# Patient Record
Sex: Female | Born: 1975 | Race: Black or African American | Hispanic: No | Marital: Single | Smoking: Never smoker
Health system: Southern US, Community
[De-identification: ages and names within clinical notes are randomized; demographics above are authoritative.]

## PROBLEM LIST (undated history)

## (undated) DIAGNOSIS — R51 Headache: Secondary | ICD-10-CM

## (undated) DIAGNOSIS — N189 Chronic kidney disease, unspecified: Secondary | ICD-10-CM

## (undated) DIAGNOSIS — B029 Zoster without complications: Secondary | ICD-10-CM

## (undated) DIAGNOSIS — M329 Systemic lupus erythematosus, unspecified: Secondary | ICD-10-CM

## (undated) DIAGNOSIS — G709 Myoneural disorder, unspecified: Secondary | ICD-10-CM

## (undated) HISTORY — PX: WISDOM TOOTH EXTRACTION: SHX21

## (undated) HISTORY — PX: RENAL BIOPSY: SHX156

## (undated) HISTORY — DX: Zoster without complications: B02.9

---

## 2008-06-04 ENCOUNTER — Other Ambulatory Visit: Admission: RE | Admit: 2008-06-04 | Discharge: 2008-06-04 | Payer: Self-pay | Admitting: Obstetrics & Gynecology

## 2008-07-28 ENCOUNTER — Emergency Department (HOSPITAL_COMMUNITY): Admission: EM | Admit: 2008-07-28 | Discharge: 2008-07-28 | Payer: Self-pay | Admitting: Emergency Medicine

## 2008-07-29 ENCOUNTER — Ambulatory Visit: Payer: Self-pay | Admitting: Vascular Surgery

## 2008-07-29 ENCOUNTER — Ambulatory Visit: Admission: RE | Admit: 2008-07-29 | Discharge: 2008-07-29 | Payer: Self-pay | Admitting: Emergency Medicine

## 2008-07-29 ENCOUNTER — Encounter (INDEPENDENT_AMBULATORY_CARE_PROVIDER_SITE_OTHER): Payer: Self-pay | Admitting: Emergency Medicine

## 2008-09-25 ENCOUNTER — Encounter: Admission: RE | Admit: 2008-09-25 | Discharge: 2008-09-25 | Payer: Self-pay | Admitting: Internal Medicine

## 2009-07-24 ENCOUNTER — Emergency Department (HOSPITAL_COMMUNITY): Admission: EM | Admit: 2009-07-24 | Discharge: 2009-07-24 | Payer: Self-pay | Admitting: Emergency Medicine

## 2009-11-27 ENCOUNTER — Observation Stay (HOSPITAL_COMMUNITY): Admission: EM | Admit: 2009-11-27 | Discharge: 2009-11-28 | Payer: Self-pay | Admitting: Emergency Medicine

## 2010-01-17 ENCOUNTER — Encounter
Admission: RE | Admit: 2010-01-17 | Discharge: 2010-01-17 | Payer: Self-pay | Source: Home / Self Care | Attending: Unknown Physician Specialty | Admitting: Unknown Physician Specialty

## 2010-06-08 LAB — BASIC METABOLIC PANEL
CO2: 27 mEq/L (ref 19–32)
Calcium: 8.3 mg/dL — ABNORMAL LOW (ref 8.4–10.5)
Creatinine, Ser: 0.58 mg/dL (ref 0.4–1.2)
GFR calc Af Amer: >60 mL/min (ref >60)
GFR calc non Af Amer: >60 mL/min (ref >60)
GFR calc non Af Amer: >60 mL/min (ref >60)
Potassium: 3.8 mEq/L (ref 3.5–5.1)
Sodium: 137 mEq/L (ref 135–145)
Sodium: 139 mEq/L (ref 135–145)

## 2010-06-08 LAB — URINALYSIS, ROUTINE W REFLEX MICROSCOPIC
Bilirubin Urine: NEGATIVE
Glucose, UA: NEGATIVE mg/dL
Ketones, ur: NEGATIVE mg/dL
Leukocytes, UA: NEGATIVE
Nitrite: NEGATIVE
Protein, ur: >300 mg/dL — AB
Specific Gravity, Urine: 1.012 (ref 1.005–1.030)
Urobilinogen, UA: 0.2 mg/dL (ref 0.0–1.0)
pH: 7 (ref 5.0–8.0)

## 2010-06-08 LAB — POCT I-STAT, CHEM 8
BUN: 21 mg/dL (ref 6–23)
Calcium, Ion: 1.2 mmol/L (ref 1.12–1.32)
Chloride: 107 mEq/L (ref 96–112)
Creatinine, Ser: 0.7 mg/dL (ref 0.4–1.2)
Glucose, Bld: 124 mg/dL — ABNORMAL HIGH (ref 70–99)
HCT: 41 % (ref 36.0–46.0)
Hemoglobin: 13.9 g/dL (ref 12.0–15.0)
Potassium: 4.4 mEq/L (ref 3.5–5.1)
Sodium: 139 meq/L (ref 135–145)
TCO2: 25 mmol/L (ref 0–100)

## 2010-06-08 LAB — URINE MICROSCOPIC-ADD ON

## 2010-06-08 LAB — CK: Total CK: 116 U/L (ref 7–177)

## 2010-08-16 ENCOUNTER — Other Ambulatory Visit: Payer: Self-pay | Admitting: Obstetrics & Gynecology

## 2010-08-16 DIAGNOSIS — N920 Excessive and frequent menstruation with regular cycle: Secondary | ICD-10-CM

## 2010-08-17 ENCOUNTER — Ambulatory Visit (HOSPITAL_COMMUNITY)
Admission: RE | Admit: 2010-08-17 | Discharge: 2010-08-17 | Disposition: A | Discharge status: Home / Self Care | Payer: BC Managed Care – PPO | Source: Ambulatory Visit | Attending: Obstetrics & Gynecology | Admitting: Obstetrics & Gynecology

## 2010-08-17 ENCOUNTER — Other Ambulatory Visit (HOSPITAL_COMMUNITY): Payer: Self-pay

## 2010-08-17 DIAGNOSIS — N920 Excessive and frequent menstruation with regular cycle: Secondary | ICD-10-CM | POA: Insufficient documentation

## 2010-08-17 DIAGNOSIS — D252 Subserosal leiomyoma of uterus: Secondary | ICD-10-CM | POA: Insufficient documentation

## 2010-10-18 ENCOUNTER — Encounter (INDEPENDENT_AMBULATORY_CARE_PROVIDER_SITE_OTHER): Payer: BC Managed Care – PPO | Admitting: Ophthalmology

## 2010-10-18 DIAGNOSIS — H43819 Vitreous degeneration, unspecified eye: Secondary | ICD-10-CM

## 2010-10-18 DIAGNOSIS — H33309 Unspecified retinal break, unspecified eye: Secondary | ICD-10-CM

## 2010-10-19 ENCOUNTER — Ambulatory Visit (INDEPENDENT_AMBULATORY_CARE_PROVIDER_SITE_OTHER): Payer: BC Managed Care – PPO | Admitting: Ophthalmology

## 2011-04-04 ENCOUNTER — Encounter (HOSPITAL_COMMUNITY): Payer: Self-pay | Admitting: Pharmacist

## 2011-04-09 ENCOUNTER — Encounter (HOSPITAL_COMMUNITY): Admission: RE | Admit: 2011-04-09 | Payer: BC Managed Care – PPO | Source: Ambulatory Visit

## 2011-04-10 ENCOUNTER — Encounter (HOSPITAL_COMMUNITY): Payer: Self-pay

## 2011-04-10 ENCOUNTER — Encounter (HOSPITAL_COMMUNITY)
Admission: RE | Admit: 2011-04-10 | Discharge: 2011-04-10 | Disposition: A | Discharge status: Home / Self Care | Payer: BC Managed Care – PPO | Source: Ambulatory Visit | Attending: Obstetrics and Gynecology | Admitting: Obstetrics and Gynecology

## 2011-04-10 HISTORY — DX: Systemic lupus erythematosus, unspecified: M32.9

## 2011-04-10 HISTORY — DX: Headache: R51

## 2011-04-10 HISTORY — DX: Chronic kidney disease, unspecified: N18.9

## 2011-04-10 HISTORY — DX: Myoneural disorder, unspecified: G70.9

## 2011-04-10 LAB — CBC
HCT: 36.3 % (ref 36.0–46.0)
Hemoglobin: 12.1 g/dL (ref 12.0–15.0)
MCH: 30.9 pg (ref 26.0–34.0)
MCHC: 33.3 g/dL (ref 30.0–36.0)
MCV: 92.6 fL (ref 78.0–100.0)
Platelets: 262 K/uL (ref 150–400)
RBC: 3.92 MIL/uL (ref 3.87–5.11)
RDW: 13.7 % (ref 11.5–15.5)
WBC: 5.8 K/uL (ref 4.0–10.5)

## 2011-04-10 LAB — SURGICAL PCR SCREEN: MRSA, PCR: NEGATIVE

## 2011-04-10 LAB — BASIC METABOLIC PANEL WITH GFR
BUN: 15 mg/dL (ref 6–23)
CO2: 26 meq/L (ref 19–32)
Calcium: 9.5 mg/dL (ref 8.4–10.5)
Chloride: 103 meq/L (ref 96–112)
Creatinine, Ser: 0.6 mg/dL (ref 0.50–1.10)
GFR calc Af Amer: >90 mL/min
GFR calc non Af Amer: >90 mL/min
Glucose, Bld: 104 mg/dL — ABNORMAL HIGH (ref 70–99)
Potassium: 4 meq/L (ref 3.5–5.1)
Sodium: 136 meq/L (ref 135–145)

## 2011-04-10 NOTE — Patient Instructions (Addendum)
YOUR PROCEDURE IS SCHEDULED ON:04/13/11  ENTER THROUGH THE MAIN ENTRANCE OF [facility] AT:10am  USE DESK PHONE AND DIAL [phone] TO INFORM us OF YOUR ARRIVAL  CALL [phone] IF YOU HAVE ANY QUESTIONS OR PROBLEMS PRIOR TO YOUR ARRIVAL.  REMEMBER: DO NOT EAT OR DRINK AFTER MIDNIGHT :Thursday  YOU MAY BRUSH YOUR TEETH THE MORNING OF SURGERY   TAKE THESE MEDICINES THE DAY OF SURGERY WITH SIP OF WATER:none   DO NOT WEAR JEWELRY, EYE MAKEUP, LIPSTICK OR DARK FINGERNAIL POLISH DO NOT WEAR LOTIONS on day of surgery. DO NOT SHAVE FOR 48 HOURS PRIOR TO SURGERY  YOU WILL NOT BE ALLOWED TO DRIVE YOURSELF HOME.  NAME OF DRIVER:Joseph or Mathis Dad

## 2011-04-12 NOTE — Consult Note (Addendum)
  HISTORY OF PRESENT ILLNESS: Terry Ayala is a 36 year old, gravida 0, African-American female, initially referred to me by Lonie Peak, MD, scheduled for a laparoscopic robotic-assisted myomectomy.  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  She was adequately counseled about her options such as laparoscopy versus laparotomy, since she is also interested in future childbearing.  She is also interested in fertility preservation for social reasons, which we will address after she recovers from the surgery.  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  MEDICATIONS: 1.  Prednisone. 2.  Plaquenil. 3.  Imuran. 4.  Neurontin 5.  Os-Cal. 6.  Multivitamins. 7.  Biotin. 8.  Fish oil. 9.  Lotensin.  (She was advised by her rheumatologist to discontinue the preventive Lotensin before.) surgery.  ALLERGIES:  DARVOCET.  SOCIAL HISTORY:  She denies substance abuse.  She exercises regularly.  She is single and works as Land.  SURGICAL HISTORY:  She has had kidney biopsies in 1996 and 1997.  REVIEW OF SYSTEMS:  All 12 systems were reviewed as documented in the attached questionnaire.  It is pertinent for lupus erythematosus, numbness, migraine and unexplained rash, as well as headaches.  FAMILY HISTORY:  Maternal grandmother and grandfather had a stroke from high blood pressure.  Paternal grandparents died from heart attack and maternal aunt has breast cancer.  PHYSICAL EXAM: GENERAL:  Well-developed, well-nourished African-American female in no acute distress. VITAL SIGNS:  See vital signs taken. HEENT:  No lesions, no lymphadenopathy. LUNGS:  Clear. HEART:  Regular rhythm and rate. ABDOMEN:  Soft, nontender, no hepatosplenomegaly. PELVIC EXAM:  Had to be limited because  of the patient's two transvaginal and transabdominal ultrasound, because the patient had extreme discomfort with insertion of even the slender transvaginal probe.  External genitalia, Bartholin's, Skene's and urethra normal.  Vagina is normal.  Transvaginal and transabdominal ultrasound shows a myomatous uterus with overall dimensions of the corpus of 12.2 x 11 x 11.8 cm.  Moving scanning from right to left, a transmural myoma 6.5 x 6.1 cm was seen, which was distorting the endometrium by pressure effect.  A fundal subserosal myoma 5.2 x 5.1 cm and anterior subserosal myoma 1.9 x 1.8 cm.  A posterior fundal intramural myoma of 2.9 x 3.1cm, left anterior myoma 4.9 x 3.3 cm were also noted.  Antral follicle count in each ovary is 6-8 and a corpus luteum present in the right ovary.  A saline contrast sonohysterogram was also indicated in this patient.  However, because of her discomfort we refrained from doing one today.  IMPRESSION: 1.  Symptomatic multiple large transmural, intramural and subserosal myomas. 2.  Desire to preserve fertility, the patient's desire for future childbearing.  PLAN:  I again discussed the options, the patient is definitely interested in laparoscopic robotic-assisted myomectomy.  The benefits and risks were explained, and she confirmed verbalized understanding.  We will proceed with preoperative anesthesia evaluation since she has SLE and preoperative laboratory.  The surgery is scheduled for 04/13/2011.

## 2011-04-13 ENCOUNTER — Other Ambulatory Visit: Payer: Self-pay | Admitting: Obstetrics and Gynecology

## 2011-04-13 ENCOUNTER — Encounter (HOSPITAL_COMMUNITY)
Admission: RE | Disposition: A | Discharge status: Home / Self Care | Payer: Self-pay | Source: Ambulatory Visit | Attending: Obstetrics and Gynecology

## 2011-04-13 ENCOUNTER — Ambulatory Visit (HOSPITAL_COMMUNITY): Payer: BC Managed Care – PPO | Admitting: Anesthesiology

## 2011-04-13 ENCOUNTER — Encounter (HOSPITAL_COMMUNITY): Payer: Self-pay | Admitting: *Deleted

## 2011-04-13 ENCOUNTER — Ambulatory Visit (HOSPITAL_COMMUNITY)
Admission: RE | Admit: 2011-04-13 | Discharge: 2011-04-14 | Disposition: A | Discharge status: Home / Self Care | Payer: BC Managed Care – PPO | Source: Ambulatory Visit | Attending: Obstetrics and Gynecology | Admitting: Obstetrics and Gynecology

## 2011-04-13 ENCOUNTER — Encounter (HOSPITAL_COMMUNITY): Payer: Self-pay | Admitting: Anesthesiology

## 2011-04-13 DIAGNOSIS — D251 Intramural leiomyoma of uterus: Secondary | ICD-10-CM | POA: Insufficient documentation

## 2011-04-13 DIAGNOSIS — N8000 Endometriosis of the uterus, unspecified: Secondary | ICD-10-CM | POA: Insufficient documentation

## 2011-04-13 DIAGNOSIS — Z01812 Encounter for preprocedural laboratory examination: Secondary | ICD-10-CM | POA: Insufficient documentation

## 2011-04-13 DIAGNOSIS — N80109 Endometriosis of ovary, unspecified side, unspecified depth: Secondary | ICD-10-CM | POA: Insufficient documentation

## 2011-04-13 DIAGNOSIS — Z01818 Encounter for other preprocedural examination: Secondary | ICD-10-CM | POA: Insufficient documentation

## 2011-04-13 DIAGNOSIS — D252 Subserosal leiomyoma of uterus: Secondary | ICD-10-CM | POA: Insufficient documentation

## 2011-04-13 DIAGNOSIS — N801 Endometriosis of ovary: Secondary | ICD-10-CM

## 2011-04-13 DIAGNOSIS — D219 Benign neoplasm of connective and other soft tissue, unspecified: Secondary | ICD-10-CM

## 2011-04-13 HISTORY — PX: ROBOT ASSISTED MYOMECTOMY: SHX5142

## 2011-04-13 SURGERY — ROBOTIC ASSISTED MYOMECTOMY
Anesthesia: General | Site: Uterus | Wound class: Clean Contaminated

## 2011-04-13 MED ORDER — BUPIVACAINE HCL (PF) 0.25 % IJ SOLN
INTRAMUSCULAR | Status: AC
  Filled 2011-04-13: qty 30

## 2011-04-13 MED ORDER — LIDOCAINE HCL (CARDIAC) 20 MG/ML IV SOLN
INTRAVENOUS | Status: DC | PRN
  Administered 2011-04-13: 100 mg via INTRAVENOUS

## 2011-04-13 MED ORDER — NEOSTIGMINE METHYLSULFATE 1 MG/ML IJ SOLN
INTRAMUSCULAR | Status: AC
  Filled 2011-04-13: qty 10

## 2011-04-13 MED ORDER — ONDANSETRON HCL 4 MG/2ML IJ SOLN
INTRAMUSCULAR | Status: DC | PRN
  Administered 2011-04-13: 4 mg via INTRAVENOUS

## 2011-04-13 MED ORDER — BUPIVACAINE-EPINEPHRINE 0.25% -1:200000 IJ SOLN
INTRAMUSCULAR | Status: DC | PRN
  Administered 2011-04-13: 12 mL

## 2011-04-13 MED ORDER — GLYCOPYRROLATE 0.2 MG/ML IJ SOLN
INTRAMUSCULAR | Status: DC | PRN
  Administered 2011-04-13: .4 mg via INTRAVENOUS

## 2011-04-13 MED ORDER — ROCURONIUM BROMIDE 100 MG/10ML IV SOLN
INTRAVENOUS | Status: DC | PRN
  Administered 2011-04-13: 5 mg via INTRAVENOUS
  Administered 2011-04-13: 15 mg via INTRAVENOUS
  Administered 2011-04-13: 50 mg via INTRAVENOUS
  Administered 2011-04-13: 5 mg via INTRAVENOUS

## 2011-04-13 MED ORDER — FENTANYL CITRATE 0.05 MG/ML IJ SOLN
INTRAMUSCULAR | Status: DC | PRN
  Administered 2011-04-13: 100 ug via INTRAVENOUS
  Administered 2011-04-13: 50 ug via INTRAVENOUS
  Administered 2011-04-13: 100 ug via INTRAVENOUS

## 2011-04-13 MED ORDER — CIPROFLOXACIN IN D5W 400 MG/200ML IV SOLN
INTRAVENOUS | Status: DC | PRN
  Administered 2011-04-13: 400 mg via INTRAVENOUS

## 2011-04-13 MED ORDER — GLYCOPYRROLATE 0.2 MG/ML IJ SOLN
INTRAMUSCULAR | Status: AC
  Filled 2011-04-13: qty 1

## 2011-04-13 MED ORDER — ACETAMINOPHEN 10 MG/ML IV SOLN
1000.0000 mg | Freq: Once | INTRAVENOUS | Status: AC
  Administered 2011-04-13: 1000 mg via INTRAVENOUS
  Filled 2011-04-13: qty 100

## 2011-04-13 MED ORDER — INDIGOTINDISULFONATE SODIUM 8 MG/ML IJ SOLN
INTRAMUSCULAR | Status: AC
  Filled 2011-04-13: qty 5

## 2011-04-13 MED ORDER — OXYCODONE-ACETAMINOPHEN 5-325 MG PO TABS: 1.0000 | ORAL_TABLET | ORAL | Status: AC | PRN

## 2011-04-13 MED ORDER — HYDROCORTISONE SOD SUCCINATE 100 MG IJ SOLR
100.0000 mg | Freq: Once | INTRAMUSCULAR | Status: AC
  Administered 2011-04-13: 100 g via INTRAVENOUS
  Filled 2011-04-13: qty 2

## 2011-04-13 MED ORDER — TRIAMCINOLONE ACETONIDE 40 MG/ML IJ SUSP
INTRAMUSCULAR | Status: DC | PRN
  Administered 2011-04-13: 15:00:00 via INTRAMUSCULAR

## 2011-04-13 MED ORDER — CLINDAMYCIN PHOSPHATE 900 MG/50ML IV SOLN
900.0000 mg | INTRAVENOUS | Status: DC
  Filled 2011-04-13: qty 50

## 2011-04-13 MED ORDER — HYDROMORPHONE HCL PF 1 MG/ML IJ SOLN
INTRAMUSCULAR | Status: AC
  Administered 2011-04-13: 0.5 mg via INTRAVENOUS
  Filled 2011-04-13: qty 1

## 2011-04-13 MED ORDER — METOCLOPRAMIDE HCL 5 MG/ML IJ SOLN
10.0000 mg | Freq: Once | INTRAMUSCULAR | Status: AC
  Administered 2011-04-13: 10 mg via INTRAVENOUS

## 2011-04-13 MED ORDER — VASOPRESSIN 20 UNIT/ML IJ SOLN
INTRAMUSCULAR | Status: AC
  Filled 2011-04-13: qty 2

## 2011-04-13 MED ORDER — MIDAZOLAM HCL 2 MG/2ML IJ SOLN
INTRAMUSCULAR | Status: AC
  Filled 2011-04-13: qty 2

## 2011-04-13 MED ORDER — ROCURONIUM BROMIDE 50 MG/5ML IV SOLN
INTRAVENOUS | Status: AC
  Filled 2011-04-13: qty 1

## 2011-04-13 MED ORDER — ONDANSETRON HCL 4 MG/2ML IJ SOLN
INTRAMUSCULAR | Status: AC
  Filled 2011-04-13: qty 2

## 2011-04-13 MED ORDER — NEOSTIGMINE METHYLSULFATE 1 MG/ML IJ SOLN
INTRAMUSCULAR | Status: DC | PRN
  Administered 2011-04-13: 3 mg via INTRAVENOUS

## 2011-04-13 MED ORDER — INDIGOTINDISULFONATE SODIUM 8 MG/ML IJ SOLN
INTRAMUSCULAR | Status: DC | PRN
  Administered 2011-04-13: 5 mL

## 2011-04-13 MED ORDER — LACTATED RINGERS IV SOLN: INTRAVENOUS | Status: DC

## 2011-04-13 MED ORDER — VASOPRESSIN 20 UNIT/ML IJ SOLN
INTRAMUSCULAR | Status: DC | PRN
  Administered 2011-04-13: 20 [IU]

## 2011-04-13 MED ORDER — PROPOFOL 10 MG/ML IV EMUL
INTRAVENOUS | Status: AC
  Filled 2011-04-13: qty 20

## 2011-04-13 MED ORDER — FENTANYL CITRATE 0.05 MG/ML IJ SOLN
INTRAMUSCULAR | Status: AC
  Filled 2011-04-13: qty 5

## 2011-04-13 MED ORDER — LACTATED RINGERS IV SOLN
INTRAVENOUS | Status: DC
  Administered 2011-04-13 (×3): via INTRAVENOUS

## 2011-04-13 MED ORDER — LIDOCAINE HCL (CARDIAC) 20 MG/ML IV SOLN
INTRAVENOUS | Status: AC
Start: 2011-04-13 — End: 2011-04-13
  Filled 2011-04-13: qty 5

## 2011-04-13 MED ORDER — CIPROFLOXACIN IN D5W 400 MG/200ML IV SOLN
400.0000 mg | INTRAVENOUS | Status: DC
  Filled 2011-04-13: qty 200

## 2011-04-13 MED ORDER — LACTATED RINGERS IR SOLN
Status: DC | PRN
  Administered 2011-04-13: 3000 mL

## 2011-04-13 MED ORDER — CLINDAMYCIN PHOSPHATE 600 MG/50ML IV SOLN
INTRAVENOUS | Status: DC | PRN
  Administered 2011-04-13: 900 mg via INTRAVENOUS

## 2011-04-13 MED ORDER — MIDAZOLAM HCL 5 MG/5ML IJ SOLN
INTRAMUSCULAR | Status: DC | PRN
  Administered 2011-04-13: 2 mg via INTRAVENOUS

## 2011-04-13 MED ORDER — TRIAMCINOLONE ACETONIDE 40 MG/ML IJ SUSP
40.0000 mg | Freq: Once | INTRAMUSCULAR | Status: DC
  Filled 2011-04-13: qty 1

## 2011-04-13 MED ORDER — HYDROMORPHONE HCL PF 1 MG/ML IJ SOLN
0.2500 mg | INTRAMUSCULAR | Status: DC | PRN
  Administered 2011-04-13 (×4): 0.5 mg via INTRAVENOUS

## 2011-04-13 MED ORDER — PROPOFOL 10 MG/ML IV EMUL
INTRAVENOUS | Status: DC | PRN
  Administered 2011-04-13: 200 mg via INTRAVENOUS
  Administered 2011-04-13: 50 mg via INTRAVENOUS

## 2011-04-13 MED ORDER — ONDANSETRON 4 MG PO TBDP
4.0000 mg | ORAL_TABLET | Freq: Once | ORAL | Status: DC
  Filled 2011-04-13: qty 1

## 2011-04-13 MED ORDER — METHYLENE BLUE 1 % INJ SOLN
INTRAMUSCULAR | Status: AC
  Filled 2011-04-13: qty 1

## 2011-04-13 SURGICAL SUPPLY — 52 items
BLADE LAP MORCELLATOR 15X9.5 (ELECTROSURGICAL) ×2 IMPLANT
CABLE HIGH FREQUENCY MONO STRZ (ELECTRODE) ×2 IMPLANT
CATH ROBINSON RED A/P 16FR (CATHETERS) ×4 IMPLANT
CLOTH BEACON ORANGE TIMEOUT ST (SAFETY) ×2 IMPLANT
COVER MAYO STAND STRL (DRAPES) ×2 IMPLANT
COVER TABLE BACK 60X90 (DRAPES) ×4 IMPLANT
COVER TIP SHEARS 8 DVNC (MISCELLANEOUS) ×1 IMPLANT
COVER TIP SHEARS 8MM DA VINCI (MISCELLANEOUS) ×1
DECANTER SPIKE VIAL GLASS SM (MISCELLANEOUS) ×2 IMPLANT
DERMABOND ADVANCED (GAUZE/BANDAGES/DRESSINGS) ×2
DERMABOND ADVANCED .7 DNX12 (GAUZE/BANDAGES/DRESSINGS) ×2 IMPLANT
DRAPE HUG U DISPOSABLE (DRAPE) ×2 IMPLANT
DRAPE LG THREE QUARTER DISP (DRAPES) ×4 IMPLANT
DRAPE MONITOR DA VINCI (DRAPE) IMPLANT
DRAPE WARM FLUID 44X44 (DRAPE) ×2 IMPLANT
ELECT REM PT RETURN 9FT ADLT (ELECTROSURGICAL) ×2
ELECTRODE REM PT RTRN 9FT ADLT (ELECTROSURGICAL) ×1 IMPLANT
EVACUATOR SMOKE 8.L (FILTER) ×2 IMPLANT
GLOVE BIO SURGEON STRL SZ8 (GLOVE) ×6 IMPLANT
GLOVE BIOGEL PI IND STRL 8.5 (GLOVE) ×1 IMPLANT
GLOVE BIOGEL PI INDICATOR 8.5 (GLOVE) ×1
GOWN STRL REIN XL XLG (GOWN DISPOSABLE) ×12 IMPLANT
IV STOPCOCK 4 WAY 40  W/Y SET (IV SOLUTION) ×1
IV STOPCOCK 4 WAY 40 W/Y SET (IV SOLUTION) ×1 IMPLANT
KIT ACCESSORY DA VINCI DISP (KITS) ×1
KIT ACCESSORY DVNC DISP (KITS) ×1 IMPLANT
KIT DISP ACCESSORY 4 ARM (KITS) IMPLANT
MANIPULATOR UTERINE 4.5 ZUMI (MISCELLANEOUS) ×2 IMPLANT
NEEDLE INSUFFLATION 14GA 120MM (NEEDLE) ×2 IMPLANT
NEEDLE SPNL 22GX7 QUINCKE BK (NEEDLE) ×2 IMPLANT
NS IRRIG 1000ML POUR BTL (IV SOLUTION) ×6 IMPLANT
PACK LAVH (CUSTOM PROCEDURE TRAY) ×2 IMPLANT
SCISSORS LAP 5X35 DISP (ENDOMECHANICALS) ×2 IMPLANT
SEPRAFILM MEMBRANE 5X6 (MISCELLANEOUS) ×4 IMPLANT
SET IRRIG TUBING LAPAROSCOPIC (IRRIGATION / IRRIGATOR) ×2 IMPLANT
SOLUTION ELECTROLUBE (MISCELLANEOUS) ×2 IMPLANT
SUT PDS AB 4-0 SH 27 (SUTURE) IMPLANT
SUT VIC AB 0 CT1 27 (SUTURE) ×1
SUT VIC AB 0 CT1 27XBRD ANTBC (SUTURE) ×1 IMPLANT
SUT VIC AB 3-0 SH 27 (SUTURE) ×1
SUT VIC AB 3-0 SH 27XBRD (SUTURE) ×1 IMPLANT
SUT VIC AB 4-0 PS2 27 (SUTURE) ×4 IMPLANT
SUT VICRYL 0 UR6 27IN ABS (SUTURE) ×4 IMPLANT
SYR 50ML LL SCALE MARK (SYRINGE) ×4 IMPLANT
SYR TOOMEY 50ML (SYRINGE) ×2 IMPLANT
TOWEL OR 17X24 6PK STRL BLUE (TOWEL DISPOSABLE) ×6 IMPLANT
TRAY FOLEY BAG SILVER LF 14FR (CATHETERS) ×2 IMPLANT
TROCAR DISP BLADELESS 8 DVNC (TROCAR) ×1 IMPLANT
TROCAR DISP BLADELESS 8MM (TROCAR) ×1
TROCAR XCEL 12X100 BLDLESS (ENDOMECHANICALS) ×4 IMPLANT
TUBING FILTER THERMOFLATOR (ELECTROSURGICAL) ×2 IMPLANT
WARMER LAPAROSCOPE (MISCELLANEOUS) ×2 IMPLANT

## 2011-04-13 NOTE — Addendum Note (Signed)
Addendum  created 04/13/11 2135 by Lincoln Brigham, CRNA   Modules edited:Notes Section

## 2011-04-13 NOTE — Brief Op Note (Signed)
04/13/2011  6:01 PM  PATIENT:  Terry Ayala   PRE-OPERATIVE DIAGNOSIS: Transmural, intramural and subserosal myomas  POST-OPERATIVE DIAGNOSIS:  Submucosal Myomas, Stage I endometriosis of uterus and rt ovary.  PROCEDURE:   ROBOTIC ASSISTED MYOMECTOMY, excision of endometriosis  SURGEON:  Surgeon(s): Fermin Schwab, MD Myra C. Marice Potter, MD  ASSISTANTS: Nicholaus Bloom, MD   ANESTHESIA:   general  EBL:  Total I/O In: 2000 [I.V.:2000] Out: 450 [Urine:150; Blood:300]  BLOOD ADMINISTERED:none  DRAINS: none   LOCAL MEDICATIONS USED:  MARCAINE , KENALOG, DILUTE VASOPRESSIN  SPECIMEN:  Source of Specimen:  Myoma pieces  DISPOSITION OF SPECIMEN:  PATHOLOGY  COUNTS:  YES  .Other Dictation: Dictation Number   PLAN OF CARE: Discharge to home after PACU  PATIENT DISPOSITION:  PACU - hemodynamically stable.

## 2011-04-13 NOTE — Brief Op Note (Deleted)
04/13/2011  6:08 PM  PATIENT:  Terry Ayala  36 y.o. female  PRE-OPERATIVE DIAGNOSIS:  Submucosal Myomas  POST-OPERATIVE DIAGNOSIS:  Submucosal Myomas, Endometriosis  PROCEDURE:  Procedure(s): ROBOTIC ASSISTED MYOMECTOMY  SURGEON:  Surgeon(s): Fermin Schwab, MD Myra C. Marice Potter, MD ASSISTANTS: Nicholaus Bloom, MD   ANESTHESIA:   general  EBL:  Total I/O In: 2000 [I.V.:2000] Out: 450 [Urine:150; Blood:300]  BLOOD ADMINISTERED:none  DRAINS: none   LOCAL MEDICATIONS USED:  MARCAINE, VASOPRESSIN, KENALOG  SPECIMEN:  Source of Specimen:  UTERINE FIBROIDS WEIGHING 334 gm.  DISPOSITION: PACU STABLE.

## 2011-04-13 NOTE — Anesthesia Postprocedure Evaluation (Signed)
  Anesthesia Post-op Note  Patient: Terry Ayala  Procedure(s) Performed:  ROBOTIC ASSISTED MYOMECTOMY  Patient Location: PACU  Anesthesia Type: General  Level of Consciousness: awake, alert  and oriented  Airway and Oxygen Therapy: Patient Spontanous Breathing  Post-op Pain: mild  Post-op Assessment: Post-op Vital signs reviewed, Patient's Cardiovascular Status Stable, Respiratory Function Stable, Patent Airway, No signs of Nausea or vomiting and Pain level controlled  Post-op Vital Signs: Reviewed and stable  Complications: No apparent anesthesia complications

## 2011-04-13 NOTE — H&P (Signed)
No change in  H& P

## 2011-04-13 NOTE — Op Note (Signed)
Dictated # [id number]

## 2011-04-13 NOTE — Anesthesia Preprocedure Evaluation (Addendum)
Anesthesia Evaluation    Airway       Dental   Pulmonary          Cardiovascular     Neuro/Psych  Neuromuscular disease (Neuritis as result of Lupus in LE's, No sx currently, but discernable on EMG     also has shoulder/neck pain; recently rx'sd with Accupuncture)    GI/Hepatic   Endo/Other  Hyperthyroidism   Renal/GU Renal diseaseLupus, stable steroid dose Feels well, no current sx with Lupus     Musculoskeletal   Abdominal   Peds  Hematology   Anesthesia Other Findings Will give Steroid bolus for surgery  Reproductive/Obstetrics                           Anesthesia Physical Anesthesia Plan  ASA: III  Anesthesia Plan: General   Post-op Pain Management:    Induction: Intravenous  Airway Management Planned: Oral ETT  Additional Equipment:   Intra-op Plan:   Post-operative Plan:   Informed Consent: I have reviewed the patients History and Physical, chart, labs and discussed the procedure including the risks, benefits and alternatives for the proposed anesthesia with the patient or authorized representative who has indicated his/her understanding and acceptance.   Dental Advisory Given and Dental advisory given  Plan Discussed with: CRNA and Surgeon  Anesthesia Plan Comments: (  Discussed  general anesthesia, including possible nausea, instrumentation of airway, sore throat,pulmonary aspiration, etc. I asked if the were any outstanding questions, or  concerns before we proceeded. )        Anesthesia Quick Evaluation

## 2011-04-13 NOTE — Transfer of Care (Signed)
Immediate Anesthesia Transfer of Care Note  Patient: Terry Ayala  Procedure(s) Performed:  ROBOTIC ASSISTED MYOMECTOMY  Patient Location: PACU  Anesthesia Type: General  Level of Consciousness: awake, patient cooperative and responds to stimulation  Airway & Oxygen Therapy: Patient Spontanous Breathing and Patient connected to nasal cannula oxygen  Post-op Assessment: Report given to PACU RN and Post -op Vital signs reviewed and stable  Post vital signs: stable Filed Vitals:   04/13/11 1036  BP: 113/76  Pulse:   Temp:   Resp:     Complications: No apparent anesthesia complications

## 2011-04-13 NOTE — Anesthesia Postprocedure Evaluation (Addendum)
  Anesthesia Post-op Note  Patient: Terry Ayala  Procedure(s) Performed:  ROBOTIC ASSISTED MYOMECTOMY  Patient Location: [facility]  Anesthesia Type: General  Level of Consciousness: awake, alert  and oriented  Airway and Oxygen Therapy: Patient Spontanous Breathing  Post-op Pain: mild  Post-op Assessment: cardiovascular stable, pain controlled, airway stable  Post-op Vital Signs: stable  Complications: No apparent anesthesia complications

## 2011-04-14 DIAGNOSIS — D259 Leiomyoma of uterus, unspecified: Secondary | ICD-10-CM

## 2011-04-14 LAB — CBC
MCV: 90.3 fL (ref 78.0–100.0)
Platelets: 203 10*3/uL (ref 150–400)
RBC: 3.31 MIL/uL — ABNORMAL LOW (ref 3.87–5.11)
WBC: 6 10*3/uL (ref 4.0–10.5)

## 2011-04-14 LAB — BASIC METABOLIC PANEL
CO2: 26 mEq/L (ref 19–32)
Chloride: 103 mEq/L (ref 96–112)
Creatinine, Ser: 0.64 mg/dL (ref 0.50–1.10)
Potassium: 3.9 mEq/L (ref 3.5–5.1)

## 2011-04-14 MED ORDER — OXYCODONE-ACETAMINOPHEN 5-325 MG PO TABS
1.0000 | ORAL_TABLET | Freq: Once | ORAL | Status: AC
  Administered 2011-04-14: 1 via ORAL
  Filled 2011-04-14: qty 1

## 2011-04-14 MED ORDER — OXYCODONE-ACETAMINOPHEN 5-325 MG PO TABS
2.0000 | ORAL_TABLET | Freq: Once | ORAL | Status: AC
  Administered 2011-04-14: 1 via ORAL
  Filled 2011-04-14: qty 2

## 2011-04-14 NOTE — Progress Notes (Signed)
Pt d/c home with family via wc to private car. D/c instructions and prescriptions reviewed with pt. Pt verbalized understanding.

## 2011-04-14 NOTE — Progress Notes (Signed)
Pt's family at bedside, informed pt that the MD has her paperwork and prescription ready for discharge. Pt states she will call when ready.

## 2011-04-14 NOTE — Op Note (Signed)
NAME:  Terry Ayala, Terry Ayala NO.:  [id number]  MEDICAL RECORD NO.:  [id number]  LOCATION:  9320                          FACILITY:  [facility]  PHYSICIAN:  Fermin Schwab, MD   DATE OF BIRTH:  [birth year]  DATE OF PROCEDURE:  04/13/2011 DATE OF DISCHARGE:                              OPERATIVE REPORT   PREOPERATIVE DIAGNOSIS:  Multiple uterine myomas, rule out endometriosis.  POSTOPERATIVE DIAGNOSES:  Multiple uterine transmural, intramural, and subserosal myomas, stage I endometriosis of right ovary and uterine serosa.  SURGEON:  Fermin Schwab, MD  ASSISTANT:  Allie Bossier, MD.  ANESTHESIA:  General endotracheal.  COMPLICATIONS:  None.  ESTIMATED BLOOD LOSS:  300 mL.  SPECIMENS:  Fragments of morcellated uterine myomas that weighed in aggregate 334 grams, to pathology.  FINDINGS:  On exam under anesthesia, the hymen was virginal.  The vagina was normal.  The cervix was grossly normal.  Uterine corpus was enlarged to 14-15 week size with multinodular myomas.  No adnexal masses were appreciated.  No posterior cul-de-sac nodularity was palpated.  On laparoscopy, diaphragm and liver surfaces were normal.  The gallbladder was very prominent.  There was a small dilation of the umbilical ring suggestive of a hernia in development.  Both tubes were normal in their proximal and distal segment with fimbriae 5/5.  The left ovary was normal.  The right ovary had a corpus luteum.  In addition, there were areas suggestive of endometriosis (pink adhesions) on the surface of the right ovary that measured less than 1 cm.  There were __________ lesions on the prominent aspect of the large posterior myoma, on the serosa. There were no other endometriotic lesions encountered.  The uterus was enlarged with a posterior transmural myoma 6.5 x 6.1 cm pushing the endometrium to the left.  There was a fundal subserosal myoma 5.2 x 5.1 on the right and other one that was subserosal 2.9  x 3.1.  There was an anterior intramural myoma that was 4.9 x 3.3 and a fifth myoma from 1.9 x 1.8 on the fundus as a subserosal/intramural myoma.  DESCRIPTION OF PROCEDURE:  The patient was placed in dorsal supine position.  SCDs, stockings were placed and general anesthesia was given. Cleocin and ciprofloxacin were given for prophylaxis as the patient was PENICILLIN allergic.  She was prepped and draped in lithotomy position and Foley catheter was inserted into the bladder.  A ZUMI catheter was placed into the uterus after it sounded to 10 cm and its balloon was inflated.  Indigo carmine was injected during the case through this manipulator to define the endometrium.  A 12 mm incision was made after whole injection sites were preemptively anesthetized with 0.25% Marcaine with 1:200,000 epinephrine.  A Veress needle was inserted.  Pneumoperitoneum was created after confirming correct location.  A 12 mm trocar was inserted and video laparoscopy was started.  This incision was 5 cm above the umbilicus.  Two 8 mm incisions were made 8 cm lateral to this incision and slightly below this level.  Another incision was made 8 cm lateral to the left lateral incision.  Finally a fifth incision of 12 mm was made lateral to the right lateral  incision as the free instrument ports.  Respective trocars were placed.  The patient was placed in steep Trendelenburg position and the [employer] Si robot was docked to the patient.  The surgeon resumed rest of the case from the surgical consult.  A dilated vasopressin was injected at this point and then later during the case adding to total of 30 mL of 0.33 units per mL dilution.  This was done with a spinal needle inserted suprapubically.  Using cutting and coagulating current of 45-50 watts, subserosal fundal fibroids were easily resected.  Then the anterior large intramural myoma was incised and it was removed from the anterior uterine wall after some  dissection. After removing a total of these 4 fibroids, we closed the uterine defect with 2 layers of 2-0 V-Loc suture closing 1-1.5 cm point of entry to the endometrial cavity in the meantime.  Next the serosa layer was approximated with 3-0 V-Loc suture in continuous manner.  Next a transverse incision had to be made in the mid posterior uterine corpus to remove this 6.5 cm transmural myoma.  We were careful not to enter the endometrial cavity and resected the myoma successfully.  The resulting defect was closed in 3 layers with 2-0 V-Loc and 3-0 V-Loc respectively.  Adequate hemostasis was ensured.  The myomas were morcellated with a Lendell Caprice morcellator and removed.  Small chips were also picked up.  Pelvis was irrigated and a slurry of Seprafilm consisting of 2 sheets of the adhesion barrier and 40 mL of lactated ringer was injected into the pelvis with a red rubber catheter at the end of the case.  Instrument count and lap pad count was correct.  The gas was allowed to come out.  The sleeves were removed.  Large incisions were approximated with figure-of-eight sutures of 0 Vicryl and a UR6 needle.  The skin edges were injected with Kenalog diluted in Marcaine and skin edges were approximated with Dermabond.  Estimated blood loss was 300 mL.  The patient tolerated the procedure well and was transferred to recovery room in satisfactory condition.     Fermin Schwab, MD     TY/MEDQ  D:  04/13/2011  T:  04/14/2011  Job:  [id number]  cc:   Leda Quail, MD  Allie Bossier, MD

## 2011-04-16 ENCOUNTER — Encounter (HOSPITAL_COMMUNITY): Payer: Self-pay | Admitting: Obstetrics and Gynecology

## 2011-04-17 DIAGNOSIS — D219 Benign neoplasm of connective and other soft tissue, unspecified: Secondary | ICD-10-CM | POA: Diagnosis present

## 2011-07-31 LAB — HM PAP SMEAR: HM Pap smear: NEGATIVE

## 2011-10-19 ENCOUNTER — Encounter (INDEPENDENT_AMBULATORY_CARE_PROVIDER_SITE_OTHER): Payer: BC Managed Care – PPO | Admitting: Ophthalmology

## 2012-02-24 HISTORY — PX: CORRECTION HAMMER TOE: SUR317

## 2012-07-24 DIAGNOSIS — B029 Zoster without complications: Secondary | ICD-10-CM

## 2012-07-24 HISTORY — DX: Zoster without complications: B02.9

## 2012-08-21 ENCOUNTER — Encounter: Payer: Self-pay | Admitting: Obstetrics & Gynecology

## 2012-08-22 ENCOUNTER — Encounter: Payer: Self-pay | Admitting: Obstetrics & Gynecology

## 2012-08-22 ENCOUNTER — Ambulatory Visit (INDEPENDENT_AMBULATORY_CARE_PROVIDER_SITE_OTHER): Payer: BC Managed Care – PPO | Admitting: Obstetrics & Gynecology

## 2012-08-22 VITALS — BP 122/74 | Ht 74.0 in | Wt 124.0 lb

## 2012-08-22 DIAGNOSIS — Z01419 Encounter for gynecological examination (general) (routine) without abnormal findings: Secondary | ICD-10-CM

## 2012-08-22 MED ORDER — CLOBETASOL PROPIONATE 0.05 % EX OINT: TOPICAL_OINTMENT | Freq: Two times a day (BID) | CUTANEOUS | Status: AC

## 2012-08-22 NOTE — Progress Notes (Addendum)
Patient ID: Terry Ayala, female   DOB: 08-13-1975, 37 y.o.   MRN: [id number]  37 y.o. G0P0000 SingleAfrican AmericanF here for annual exam.  Seeing voice therapy at [facility].  She is doing really well with this and her voice is lower.  She reports she is having increased issues with anxiety.  On medication and and seeing a therapist as well.  She feels she is "getting a handle" on this.    Had shingles 5/14.  Has skin discoloration from this.  Contemplating egg freezing.  Wants to know cost and procedures.  Knows she would need to see Dr. April Manson or another REI at that time.  Since myomectomy, cycles are MUCH better.  Flow lasts two days.  Also has some cramping.  She also notes some vaginal itching.  Major issue is itching.  She uses clobetasol prn and this helps.     Patient's last menstrual period was 08/04/2012.          Sexually active: no  The current method of family planning is none.    Exercising: no  The patient does not participate in regular exercise at present. Smoker:  no  Health Maintenance: Pap:  07/31/11 History of abnormal Pap:  no MMG:  never Colonoscopy:  2006 BMD:   2013 TDaP:  PCP Screening Labs: PCP, Hb today: PCP, Urine today: PCP   reports that she has never smoked. She has never used smokeless tobacco. She reports that  drinks alcohol. She reports that she does not use illicit drugs.  Past Medical History  Diagnosis Date  . Lupus (systemic lupus erythematosus) x 18 yrs  . Chronic kidney disease     kidneys ok now, but has had high levels protein in past with lupus flares  . Headache(784.0)   . Neuromuscular disorder     mononeuritis multiplex    Past Surgical History  Procedure Laterality Date  . Kidney surgery      biopsies   . Wisdom tooth extraction    . Robot assisted myomectomy  04/13/2011    Procedure: ROBOTIC ASSISTED MYOMECTOMY;  Surgeon: Fermin Schwab, MD;  Location: [facility];  Service: Gynecology;  Laterality: N/A;    Current Outpatient  Prescriptions  Medication Sig Dispense Refill  . ALPRAZolam (XANAX) 0.25 MG tablet Take 1 tablet by mouth as needed.      Marland Kitchen azaTHIOprine (IMURAN) 50 MG tablet Take 150 mg by mouth daily.       . benazepril (LOTENSIN) 20 MG tablet Take 0.5 tablets by mouth daily.      . calcium citrate (CALCITRATE - DOSED IN MG ELEMENTAL CALCIUM) 950 MG tablet Take 1 tablet by mouth 2 (two) times daily.      . clonazePAM (KLONOPIN) 0.5 MG tablet Take 1 tablet by mouth at bedtime.      . cyclobenzaprine (FLEXERIL) 10 MG tablet Take 1 tablet by mouth as needed.      . cycloSPORINE (RESTASIS) 0.05 % ophthalmic emulsion Place 2 drops into both eyes 2 (two) times daily.      . fluticasone (CUTIVATE) 0.05 % cream Apply 1 application topically daily.      . hydroxychloroquine (PLAQUENIL) 200 MG tablet Take 200 mg by mouth 2 (two) times daily.      . Multiple Vitamins-Minerals (MULTIVITAMINS THER. W/MINERALS) TABS Take 1 tablet by mouth daily.      . predniSONE (DELTASONE) 1 MG tablet Take 2 mg by mouth daily. Take with 5mg  tab      .  predniSONE (DELTASONE) 5 MG tablet Take 5 mg by mouth daily.       No current facility-administered medications for this visit.    Family History  Problem Relation Age of Onset  . Sarcoidosis Father     ROS:  Pertinent items are noted in HPI.  Otherwise, a comprehensive ROS was negative.  Exam:   BP 122/74  Ht 6\' 2"  (1.88 m)  Wt 124 lb (56.246 kg)  BMI 15.91 kg/m2  LMP 08/04/2012  Weight change: no change   Height: 6\' 2"  (188 cm)  Ht Readings from Last 3 Encounters:  08/22/12 6\' 2"  (1.88 m)  04/13/11 6' (1.829 m)  04/13/11 6' (1.829 m)    General appearance: alert, cooperative and appears stated age Head: Normocephalic, without obvious abnormality, atraumatic Neck: no adenopathy, supple, symmetrical, trachea midline and thyroid normal to inspection and palpation Lungs: clear to auscultation bilaterally Breasts: normal appearance, no masses or tenderness Heart: regular  rate and rhythm Abdomen: soft, non-tender; bowel sounds normal; no masses,  no organomegaly Extremities: extremities normal, atraumatic, no cyanosis or edema Skin: Skin color, texture, turgor normal. No rashes or lesions Lymph nodes: Cervical, supraclavicular, and axillary nodes normal. No abnormal inguinal nodes palpated Neurologic: Grossly normal   Pelvic: External genitalia:  no lesions              Urethra:  normal appearing urethra with no masses, tenderness or lesions              Bartholins and Skenes: normal                 Vagina: normal appearing vagina with normal color and discharge, no lesions              Cervix: no lesions              Pap taken: no Bimanual Exam:  Uterus:  mobile and slightly irregular in shape, about 8 weeks size              Adnexa: normal adnexa and no mass, fullness, tenderness               Rectovaginal: Confirms               Anus:  normal sphincter tone, no lesions  A:  Well Woman with normal exam Lupus Rheumatoid arthritis Mononeuritis multiplex H/o fibroids with laparoscopic myomectomy 2013 Recurrent vulvar itching  P:   Mammogram starting age 43 No pap smear today.  Was neg with neg HR HPV one year ago Clobetasol 0.05% ointment externally bid for up to 7 days with irritation, patient uses less than once a month F/u prn or one year for annual exam  An After Visit Summary was printed and given to the patient.

## 2012-08-22 NOTE — Patient Instructions (Signed)

## 2012-10-27 ENCOUNTER — Telehealth: Payer: Self-pay | Admitting: Obstetrics & Gynecology

## 2012-10-27 NOTE — Telephone Encounter (Signed)
Ms. Passon wanted to let Dr. Hyacinth Meeker know that she is moving to [place] due to a new job position. Chart in basket outside door.

## 2013-02-24 ENCOUNTER — Telehealth: Payer: Self-pay | Admitting: *Deleted

## 2013-02-24 NOTE — Telephone Encounter (Signed)
Informed pt that Dr Al Corpus stated google podiatrist in the [place] area and look for laser as a service offered.  Pt asked for the type of laser our office used, I informed Q-Clear system.

## 2013-02-24 NOTE — Telephone Encounter (Signed)
Pt states now lives in [place], [place] and is interested in the laser therapy.  Pt would like to know the name of a medical facility offering laser in that area.  I told pt I would ask Dr Leeanne Deed and Dr Al Corpus, and call again.

## 2013-02-24 NOTE — Telephone Encounter (Signed)
I don't know the name of any facility that provides laser therapy for nails in that region.  My suggestion would be to google podiatrists and dermatologists.  It should by listed as a product they offer.

## 2013-04-07 ENCOUNTER — Telehealth: Payer: Self-pay | Admitting: Obstetrics & Gynecology

## 2013-04-07 NOTE — Telephone Encounter (Signed)
Patient has moved out of town but is coming back to town for a visit. She is available to see you 1/30-04/27/13 and is scheduled for 04/27/13 at 3 PM for a consult with you prior to her appointment with Dr. Kerin Perna for her egg preservation. She justt wants to see you first. FYI only.

## 2013-04-13 IMAGING — US US PELVIS COMPLETE
1 series · 13 of 25 positions shown · non-contrast
Comparison: None.

CLINICAL DATA: Menorrhagia.  LMP 07/15/2010



[Series 1: us pelvis complete · 13 of 40 slices shown]
[im 1/40]
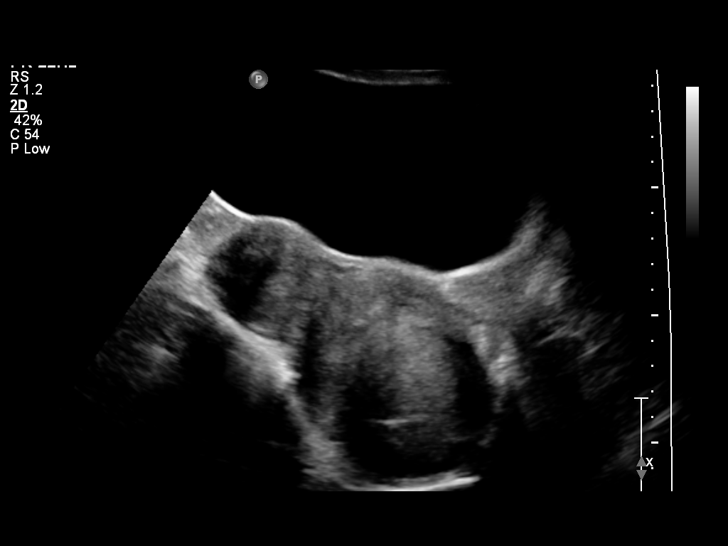
[im 4/40]
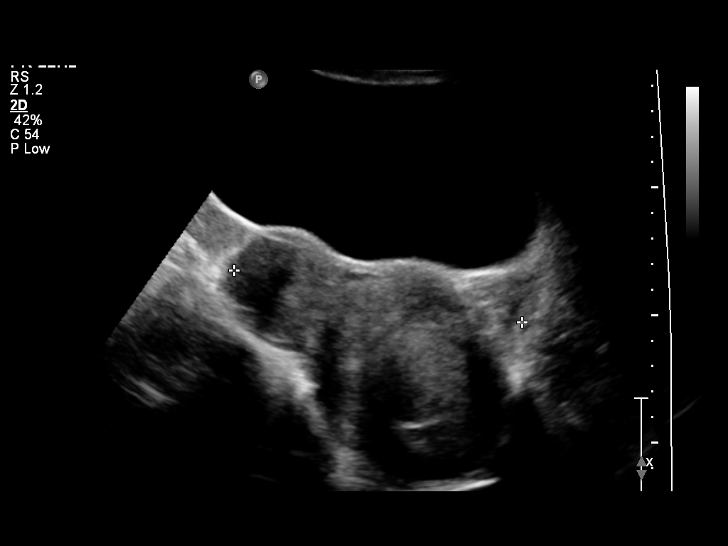
[im 7/40]
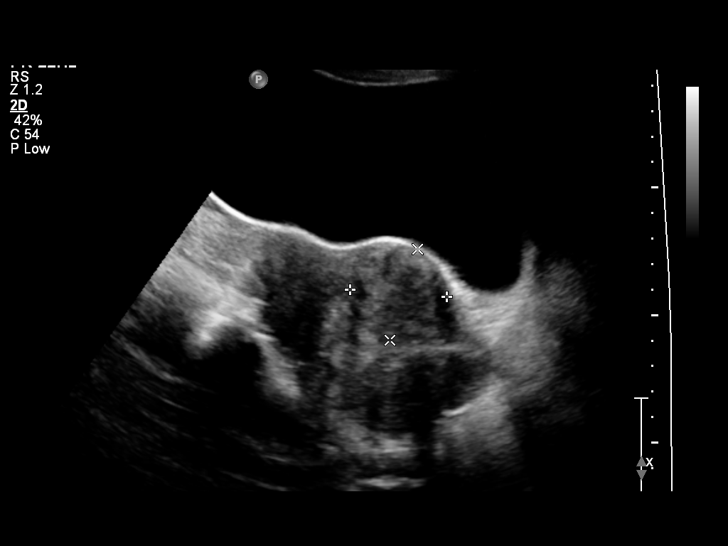
[im 10/40]
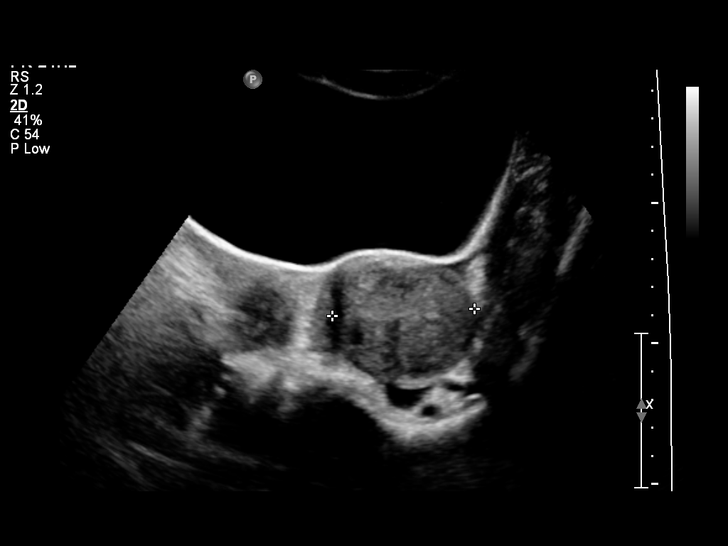
[im 14/40]
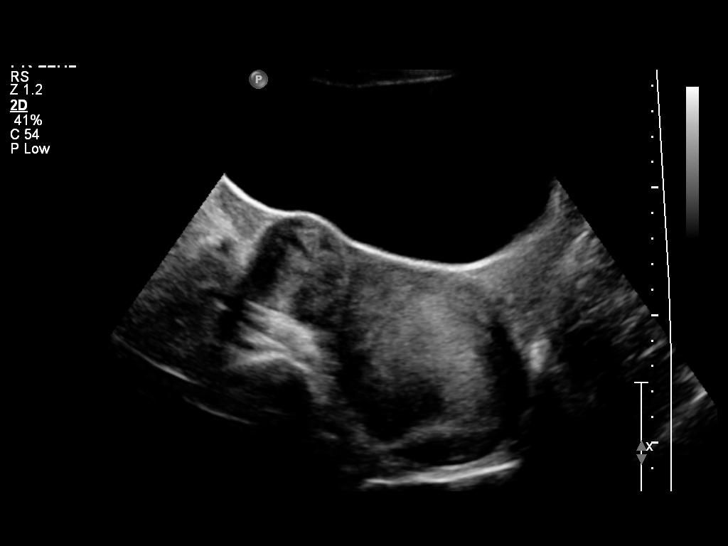
[im 17/40]
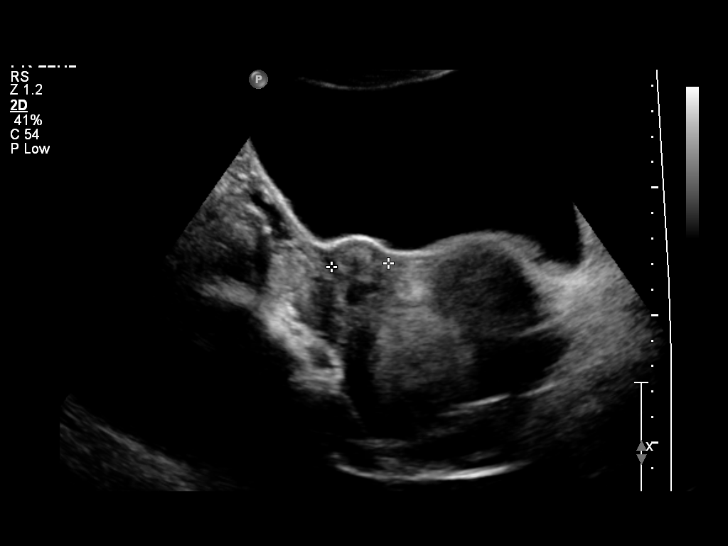
[im 20/40]
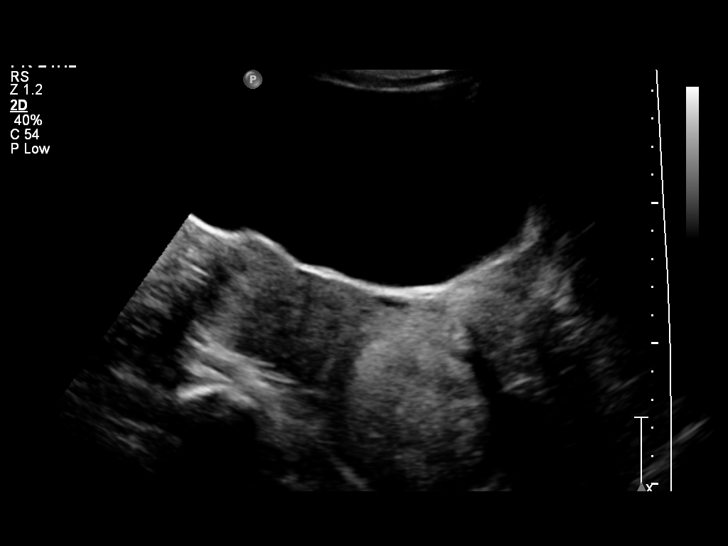
[im 23/40]
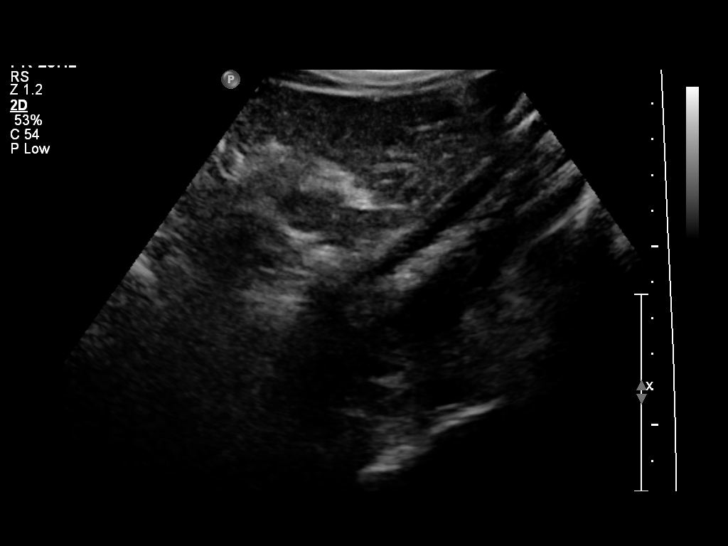
[im 27/40]
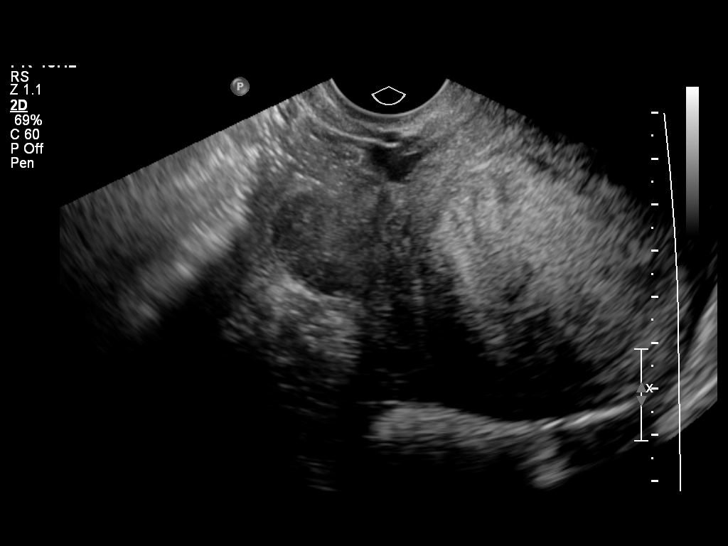
[im 30/40]
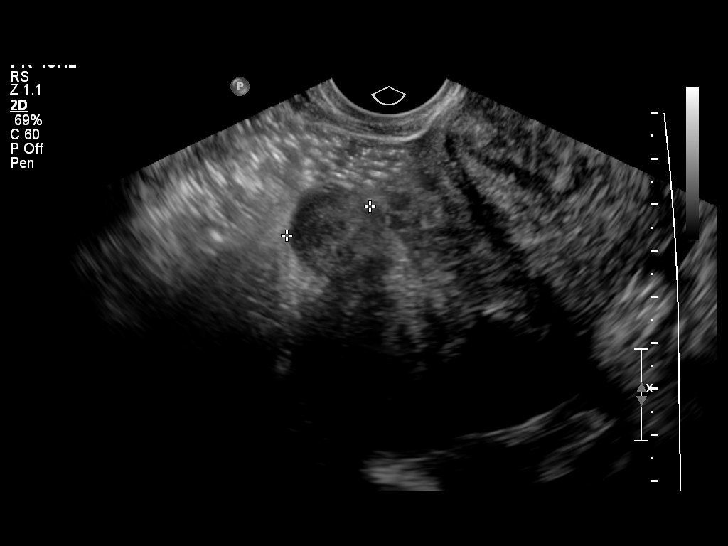
[im 33/40]
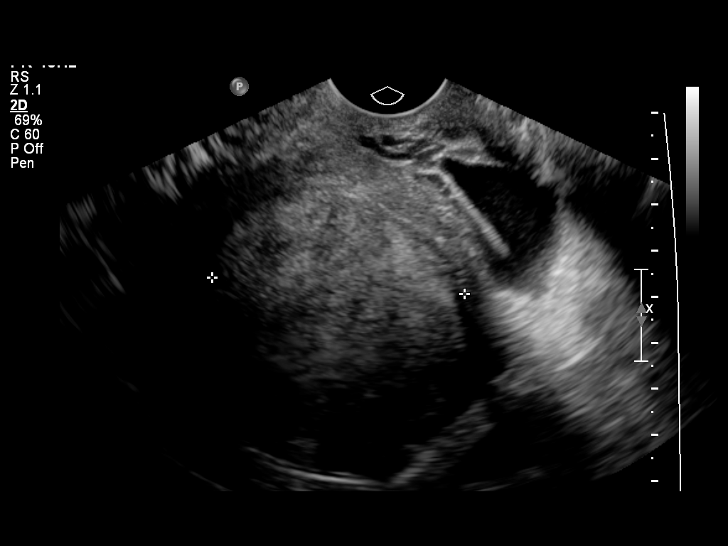
[im 36/40]
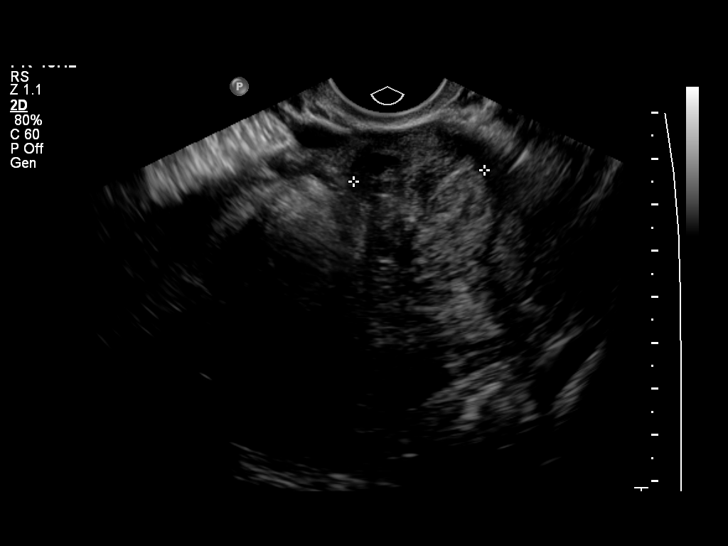
[im 40/40]
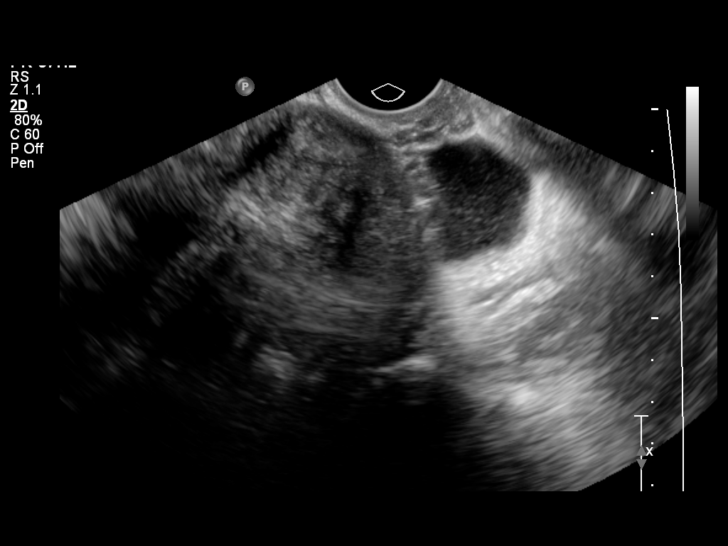

[13 of 25 positions shown; findings below may reference images not displayed]

FINDINGS: Uterus:  Is enlarged with a sagittal length of 12.5 cm, depth of
8.6 cm and a transverse width of 9.4 cm.  Multiple fibroids are
identified with the largest having sizes and locations as follows:
Subserosal with a broad based attachment involving the posterior
lower uterine segment measuring 6.8 x 6.4 x 5.9 cm, in the left
lateral mid body measuring 3.8 x 3.7 x 3.9 cm and predominately
mural with a small subserosal component, subserosal emanating off
of the left fundal region measuring 5.5 by 4.0 x 5.0 cm, subserosal
off of the right fundal region measuring 4.0 x 3.8 x 3.4 cm and in
the right lateral fundal region, mural in location measuring 2.6 x
2.0 x 2.2 cm.

Endometrium:  Is not clearly seen due to shadowing and possible
distortion from the aforementioned fibroids.

Right ovary:  Has a normal appearance measuring 2.4 x 1.3 x 1.9 cm.

Left ovary:  Is not seen with confidence either transabdominally or
endovaginally.

Other findings:  A small amount of complex fluid is seen in the cul-
de-sac.
IMPRESSION: Fibroid uterus with predominant fibroid sizes and locations as
noted above.  Poor evaluation of the endometrial lining was
possible due to the large fibroid load. Giventhe patient's history
of menorrhagia, if further evaluation of the relationship between
the fibroids and endometrial lining is desired, pelvic MRI with
contrast would be recommended

Normal right ovary and non-visualized left ovary.  Small amount of
complex pelvic fluid of questionable etiology.

## 2013-04-27 ENCOUNTER — Ambulatory Visit (INDEPENDENT_AMBULATORY_CARE_PROVIDER_SITE_OTHER): Payer: BC Managed Care – PPO | Admitting: Obstetrics & Gynecology

## 2013-04-27 VITALS — BP 122/70 | HR 80 | Resp 16 | Ht 74.0 in | Wt 120.8 lb

## 2013-04-27 DIAGNOSIS — Z319 Encounter for procreative management, unspecified: Secondary | ICD-10-CM

## 2013-04-27 DIAGNOSIS — M329 Systemic lupus erythematosus, unspecified: Secondary | ICD-10-CM

## 2013-04-27 NOTE — Progress Notes (Signed)
38 y.o. Single African American female [id number] here for discussion of cryopreservation of eggs.  She is doing this with Dr. Kerin Perna. Her rheumatologist was concerned about her going through the process as he was concerned about he having a flare.  She brought all of her recent kidney function labs.  She just wants to go over the process and for me to weight in on her risks--specifically since she knows I did this process less than 2 years ago and now have a 47 month old.  Reviewed the process with her.  It is only 10 days of medications and then the egg retrieval.  As she has no significant other, she will not be proceeding with egg fertilization at this time.  We did discuss the significant differences in IVF and pregnancy especially in regards to renal function.    Rheumatologist in [place], [place], is Dr. Alen Bleacher.  Healthy WD,WN female Affect: normal  A:Lupus/lupus nephritis Desirous choices for child bearing in future, despite not being in any relationships at this time   P: I have advised pt I feel an IVF cycle for her would not significantly change renal function or long term health.  I do feel differently about her being able to successfully carry a pregnancy.  She may end up with worsening renal function, so much so, that she ends up on dialysis or needing a transplant.  Pt is very aware of this and is also very open to a surrogate.  As she is very aware, I feel this is an appropriate step for her in being able to make choices in the future about having her own biologic children.  I wished her luck.  ~15 minutes spent with patient >50% of time was in face to face discussion of above.

## 2013-04-28 ENCOUNTER — Encounter: Payer: Self-pay | Admitting: Obstetrics & Gynecology

## 2013-04-28 DIAGNOSIS — M329 Systemic lupus erythematosus, unspecified: Secondary | ICD-10-CM | POA: Insufficient documentation

## 2013-05-04 ENCOUNTER — Telehealth: Payer: Self-pay | Admitting: Obstetrics & Gynecology

## 2013-05-04 NOTE — Telephone Encounter (Signed)
Routing to Dr Miller for review.  

## 2013-05-04 NOTE — Telephone Encounter (Signed)
Patient calling to check on status of her request to Dr. Sabra Heck for a referral to a facility that does "egg freezing" in the [place], [place] area.

## 2013-05-12 NOTE — Telephone Encounter (Signed)
Dr Sabra Heck, is there any information you want [place] to give to patient?

## 2013-05-12 NOTE — Telephone Encounter (Signed)
I have contacted a friend in the medical field (not ob/gyn) who lives in [place].  The only person who she knows of that might do this is Annabell Sabal, MD.  It does not look like it's done at [facility] in [place] or [place].  I cannot attest to anything about Dr. Ulis Rias but worth a phone call if she wants to investigate local options for her.  Phone:  [phone]

## 2013-05-13 NOTE — Telephone Encounter (Signed)
Pt is calling to see if Dr. Sabra Heck had a chance to recommend a fertility Clinic in [place] yet?

## 2013-05-13 NOTE — Telephone Encounter (Signed)
Spoke with patient and message from Dr. Sabra Heck given. Will follow up prn.  Routing to provider for final review. Patient agreeable to disposition. Will close encounter

## 2013-07-13 ENCOUNTER — Telehealth: Payer: Self-pay | Admitting: Obstetrics & Gynecology

## 2013-07-13 NOTE — Telephone Encounter (Signed)
FYI only--Patient has relocated to [place] but is here to see Dr. Kerin Perna to have her "egg surgery tomorrow." The patient wanted you to know and she appreciates all you have done for her.

## 2013-08-06 NOTE — Telephone Encounter (Signed)
Left message to call Kaitlyn at [phone]. 

## 2013-08-06 NOTE — Telephone Encounter (Signed)
Patient calling re: wants a consultation with Dr. Sabra Heck prior to relocating permanently at the end of this month. I did not have any open slots available to offer her. She also said that a phone conversation may be all that is needed. Please advise?

## 2013-08-06 NOTE — Telephone Encounter (Signed)
Spoke with patient. Terry Ayala is requesting an appointment to see Dr. Sabra Heck on the week of 5/26 to 5/29 to discuss "issues related to sex".   Patient is requesting appointment prior to relocating.   Advised would review Dr. Ammie Ferrier schedule and return her call. Patient is agreeable.

## 2013-08-06 NOTE — Telephone Encounter (Signed)
Patient returning Kaitlyn's call. °

## 2013-08-10 NOTE — Telephone Encounter (Signed)
Spoke with patient. Appointment scheduled for 12:45pm on 5/27 with Dr.Miller. Patient agreeable to date and time.  Routing to provider for final review. Patient agreeable to disposition. Will close encounter

## 2013-08-19 ENCOUNTER — Ambulatory Visit: Payer: BC Managed Care – PPO | Admitting: Obstetrics & Gynecology

## 2013-09-15 ENCOUNTER — Ambulatory Visit: Payer: BC Managed Care – PPO | Admitting: Obstetrics & Gynecology
